# Patient Record
Sex: Male | Born: 1965
Health system: Southern US, Community
[De-identification: ages and names within clinical notes are randomized; demographics above are authoritative.]

## PROBLEM LIST (undated history)

## (undated) DIAGNOSIS — E7800 Pure hypercholesterolemia, unspecified: Secondary | ICD-10-CM

## (undated) HISTORY — PX: ANAL FISTULECTOMY: SHX1139

---

## 1999-12-02 ENCOUNTER — Encounter: Payer: Self-pay | Admitting: Gastroenterology

## 1999-12-02 ENCOUNTER — Encounter: Admission: RE | Admit: 1999-12-02 | Discharge: 1999-12-02 | Payer: Self-pay | Admitting: Gastroenterology

## 1999-12-10 ENCOUNTER — Ambulatory Visit (HOSPITAL_COMMUNITY): Admission: RE | Admit: 1999-12-10 | Discharge: 1999-12-10 | Payer: Self-pay | Admitting: Gastroenterology

## 2013-01-20 ENCOUNTER — Emergency Department (HOSPITAL_COMMUNITY)
Admission: EM | Admit: 2013-01-20 | Discharge: 2013-01-21 | Disposition: A | Discharge status: Home / Self Care | Payer: BC Managed Care – PPO | Attending: Emergency Medicine | Admitting: Emergency Medicine

## 2013-01-20 ENCOUNTER — Encounter (HOSPITAL_COMMUNITY): Payer: Self-pay | Admitting: *Deleted

## 2013-01-20 DIAGNOSIS — E7800 Pure hypercholesterolemia, unspecified: Secondary | ICD-10-CM | POA: Insufficient documentation

## 2013-01-20 DIAGNOSIS — Z8249 Family history of ischemic heart disease and other diseases of the circulatory system: Secondary | ICD-10-CM | POA: Insufficient documentation

## 2013-01-20 DIAGNOSIS — M542 Cervicalgia: Secondary | ICD-10-CM | POA: Insufficient documentation

## 2013-01-20 DIAGNOSIS — R079 Chest pain, unspecified: Secondary | ICD-10-CM

## 2013-01-20 DIAGNOSIS — Z7982 Long term (current) use of aspirin: Secondary | ICD-10-CM | POA: Insufficient documentation

## 2013-01-20 DIAGNOSIS — Z79899 Other long term (current) drug therapy: Secondary | ICD-10-CM | POA: Insufficient documentation

## 2013-01-20 DIAGNOSIS — M79609 Pain in unspecified limb: Secondary | ICD-10-CM | POA: Insufficient documentation

## 2013-01-20 HISTORY — DX: Pure hypercholesterolemia, unspecified: E78.00

## 2013-01-20 LAB — POCT I-STAT TROPONIN I: Troponin i, poc: 0.02 ng/mL (ref 0.00–0.08)

## 2013-01-20 LAB — COMPREHENSIVE METABOLIC PANEL
ALT: 41 U/L (ref 0–53)
AST: 33 U/L (ref 0–37)
Albumin: 4.2 g/dL (ref 3.5–5.2)
Alkaline Phosphatase: 81 U/L (ref 39–117)
BUN: 20 mg/dL (ref 6–23)
CO2: 27 mEq/L (ref 19–32)
Calcium: 9.5 mg/dL (ref 8.4–10.5)
Chloride: 100 mEq/L (ref 96–112)
Creatinine, Ser: 0.95 mg/dL (ref 0.50–1.35)
GFR calc Af Amer: >90 mL/min (ref >90)
GFR calc non Af Amer: >90 mL/min (ref >90)
Glucose, Bld: 105 mg/dL — ABNORMAL HIGH (ref 70–99)
Potassium: 4.3 mEq/L (ref 3.5–5.1)
Sodium: 136 mEq/L (ref 135–145)
Total Bilirubin: 0.9 mg/dL (ref 0.3–1.2)
Total Protein: 7.2 g/dL (ref 6.0–8.3)

## 2013-01-20 LAB — CBC WITH DIFFERENTIAL/PLATELET
Basophils Absolute: 0 10*3/uL (ref 0.0–0.1)
Basophils Relative: 0 % (ref 0–1)
Eosinophils Absolute: 0.3 10*3/uL (ref 0.0–0.7)
Eosinophils Relative: 4 % (ref 0–5)
HCT: 41.7 % (ref 39.0–52.0)
Hemoglobin: 15 g/dL (ref 13.0–17.0)
Lymphocytes Relative: 34 % (ref 12–46)
Lymphs Abs: 3 10*3/uL (ref 0.7–4.0)
MCH: 31.1 pg (ref 26.0–34.0)
MCHC: 36 g/dL (ref 30.0–36.0)
MCV: 86.3 fL (ref 78.0–100.0)
Monocytes Absolute: 1.1 10*3/uL — ABNORMAL HIGH (ref 0.1–1.0)
Monocytes Relative: 12 % (ref 3–12)
Neutro Abs: 4.4 10*3/uL (ref 1.7–7.7)
Neutrophils Relative %: 50 % (ref 43–77)
Platelets: 191 10*3/uL (ref 150–400)
RBC: 4.83 MIL/uL (ref 4.22–5.81)
RDW: 11.8 % (ref 11.5–15.5)
WBC: 8.8 10*3/uL (ref 4.0–10.5)

## 2013-01-20 NOTE — ED Notes (Signed)
Pt states that he has burning CP that starts in his left upper chest. Pt states that it has been going on for 3 days pt states radiates down left arm and into jaw with burning. Pt has hx of high cholesterol and family hx of heart disease.

## 2013-01-21 ENCOUNTER — Emergency Department (HOSPITAL_COMMUNITY): Payer: BC Managed Care – PPO

## 2013-01-21 LAB — POCT I-STAT TROPONIN I: Troponin i, poc: 0.01 ng/mL (ref 0.00–0.08)

## 2013-01-21 MED ORDER — MORPHINE SULFATE 4 MG/ML IJ SOLN
INTRAMUSCULAR | Status: AC
  Filled 2013-01-21: qty 1

## 2013-01-21 MED ORDER — KETOROLAC TROMETHAMINE 60 MG/2ML IM SOLN
60.0000 mg | Freq: Once | INTRAMUSCULAR | Status: AC
  Administered 2013-01-21: 60 mg via INTRAMUSCULAR
  Filled 2013-01-21: qty 2

## 2013-01-21 MED ORDER — NITROGLYCERIN 0.4 MG SL SUBL
0.4000 mg | SUBLINGUAL_TABLET | SUBLINGUAL | Status: DC | PRN
  Administered 2013-01-21 (×2): 0.4 mg via SUBLINGUAL
  Filled 2013-01-21: qty 25

## 2013-01-21 MED ORDER — MORPHINE SULFATE 4 MG/ML IJ SOLN: 4.0000 mg | Freq: Once | INTRAMUSCULAR | Status: DC

## 2013-01-21 NOTE — ED Provider Notes (Signed)
CSN: [id number]     Arrival date & time 01/20/13  2201 History   First MD Initiated Contact with Patient 01/20/13 2331     No chief complaint on file.  (Consider location/radiation/quality/duration/timing/severity/associated sxs/prior Treatment) Patient is a 47 y.o. male presenting with chest pain. The history is provided by the patient. No language interpreter was used.  Chest Pain Pain location:  L chest Pain quality: burning   Pain radiates to:  Neck and L arm Pain radiates to the back: no   Pain severity:  Mild Onset quality:  Unable to specify Duration:  3 days Timing:  Constant Progression:  Waxing and waning Chronicity:  New Context: at rest   Relieved by:  Nothing Worsened by:  Nothing tried Ineffective treatments:  None tried Associated symptoms: no abdominal pain, no back pain, no cough, no diaphoresis, no dizziness, no dysphagia, no fatigue, no fever, no headache, no lower extremity edema, no nausea, no numbness, no shortness of breath, not vomiting and no weakness   Risk factors: high cholesterol and male sex   Risk factors: no birth control, no coronary artery disease, no diabetes mellitus, no hypertension, not obese, no prior DVT/PE and no smoking     Past Medical History  Diagnosis Date  . High cholesterol    Past Surgical History  Procedure Laterality Date  . Anal fistulectomy     History reviewed. No pertinent family history. History  Substance Use Topics  . Smoking status: Never Smoker   . Smokeless tobacco: Not on file  . Alcohol Use: Yes    Review of Systems  Constitutional: Negative for fever, diaphoresis, activity change, appetite change and fatigue.  HENT: Negative for congestion, facial swelling, rhinorrhea and trouble swallowing.   Eyes: Negative for photophobia and pain.  Respiratory: Negative for cough, chest tightness and shortness of breath.   Cardiovascular: Positive for chest pain. Negative for leg swelling.  Gastrointestinal: Negative  for nausea, vomiting, abdominal pain, diarrhea and constipation.  Endocrine: Negative for polydipsia and polyuria.  Genitourinary: Negative for dysuria, urgency, decreased urine volume and difficulty urinating.  Musculoskeletal: Negative for back pain and gait problem.  Skin: Negative for color change, rash and wound.  Allergic/Immunologic: Negative for immunocompromised state.  Neurological: Negative for dizziness, facial asymmetry, speech difficulty, weakness, numbness and headaches.  Psychiatric/Behavioral: Negative for confusion, decreased concentration and agitation.    Allergies  Review of patient's allergies indicates no known allergies.  Home Medications   Current Outpatient Rx  Name  Route  Sig  Dispense  Refill  . Ascorbic Acid (VITAMIN C) 100 MG tablet   Oral   Take 100 mg by mouth daily.         Marland Kitchen aspirin 325 MG EC tablet   Oral   Take 325 mg by mouth daily.         Marland Kitchen co-enzyme Q-10 30 MG capsule   Oral   Take 30 mg by mouth daily.         . colesevelam (WELCHOL) 625 MG tablet   Oral   Take 1,875 mg by mouth daily.         Marland Kitchen FIBER FORMULA PO   Oral   Take 1 tablet by mouth daily.         . Multiple Vitamin (MULTI-VITAMIN DAILY PO)   Oral   Take 1 tablet by mouth daily.          BP 136/91  Pulse 67  Temp(Src) 98.2 F (36.8 C) (Oral)  Resp  10  SpO2 98% Physical Exam  Constitutional: He is oriented to person, place, and time. He appears well-developed and well-nourished. No distress.  HENT:  Head: Normocephalic and atraumatic.  Mouth/Throat: No oropharyngeal exudate.  Eyes: Pupils are equal, round, and reactive to light.  Neck: Normal range of motion. Neck supple.  Cardiovascular: Normal rate, regular rhythm and normal heart sounds.  Exam reveals no gallop and no friction rub.   No murmur heard. Pulmonary/Chest: Effort normal and breath sounds normal. No respiratory distress. He has no wheezes. He has no rales.  Abdominal: Soft. Bowel  sounds are normal. He exhibits no distension and no mass. There is no tenderness. There is no rebound and no guarding.  Musculoskeletal: Normal range of motion. He exhibits no edema and no tenderness.  Neurological: He is alert and oriented to person, place, and time.  Skin: Skin is warm and dry.  Psychiatric: He has a normal mood and affect.    ED Course  Procedures (including critical care time) Labs Review Labs Reviewed  CBC WITH DIFFERENTIAL - Abnormal; Notable for the following:    Monocytes Absolute 1.1 (*)    All other components within normal limits  COMPREHENSIVE METABOLIC PANEL - Abnormal; Notable for the following:    Glucose, Bld 105 (*)    All other components within normal limits  POCT I-STAT TROPONIN I  POCT I-STAT TROPONIN I   Imaging Review Dg Chest 2 View  01/21/2013   *RADIOLOGY REPORT*  Clinical Data: Left-sided chest pain and burning sensation.  CHEST - 2 VIEW  Comparison: None.  Findings: Shallow inspiration. The heart size and pulmonary vascularity are normal. The lungs appear clear and expanded without focal air space disease or consolidation. No blunting of the costophrenic angles.  No pneumothorax.  Mediastinal contours appear intact.  Degenerative changes in the spine.  IMPRESSION: No evidence of active pulmonary disease.   Original Report Authenticated By: Burman Nieves, M.D.    Date: 01/21/2013  Rate: 79  Rhythm: normal sinus rhythm  QRS Axis: normal  Intervals: normal  ST/T Wave abnormalities: normal  Conduction Disutrbances:none  Narrative Interpretation:   Old EKG Reviewed: none available    MDM   1. Chest pain at rest    Pt is a 47 y.o. male with Pmhx as above who presents with 3 days oc constant, L sided chest burning, with radiation to neck & L arm.  No other assoc symptoms, EKG unremarkable. Has FH of CAD, but not premature CAD.  No other risk factors.  Does admit to heavier than normal drinking at the beach this weekend with friends. First  trop 0.02, 4 hrs delta trop 0.01.  Pain not changed by NTG or morphine.  Pt low risk for MACE by HEART score and I doubt ACS.  However, i have asked he call Dr. Waynard Edwards today for f/u appt and to discuss tress testing. Return precautions given for new or worsening symptoms   1. Chest pain at rest         Shanna Cisco, MD 01/21/13 2037

## 2013-01-21 NOTE — ED Notes (Signed)
Patient returned from X-ray 

## 2013-01-21 NOTE — ED Notes (Signed)
Patient transported to X-ray 

## 2014-08-29 ENCOUNTER — Encounter (HOSPITAL_COMMUNITY): Payer: Self-pay

## 2014-08-29 ENCOUNTER — Emergency Department (HOSPITAL_COMMUNITY): Payer: BLUE CROSS/BLUE SHIELD

## 2014-08-29 ENCOUNTER — Emergency Department (HOSPITAL_COMMUNITY)
Admission: EM | Admit: 2014-08-29 | Discharge: 2014-08-29 | Disposition: A | Discharge status: Home / Self Care | Payer: BLUE CROSS/BLUE SHIELD | Attending: Emergency Medicine | Admitting: Emergency Medicine

## 2014-08-29 DIAGNOSIS — N200 Calculus of kidney: Secondary | ICD-10-CM

## 2014-08-29 DIAGNOSIS — Z9889 Other specified postprocedural states: Secondary | ICD-10-CM | POA: Insufficient documentation

## 2014-08-29 DIAGNOSIS — Z79899 Other long term (current) drug therapy: Secondary | ICD-10-CM | POA: Insufficient documentation

## 2014-08-29 DIAGNOSIS — Z791 Long term (current) use of non-steroidal anti-inflammatories (NSAID): Secondary | ICD-10-CM | POA: Diagnosis not present

## 2014-08-29 DIAGNOSIS — R109 Unspecified abdominal pain: Secondary | ICD-10-CM

## 2014-08-29 DIAGNOSIS — R1031 Right lower quadrant pain: Secondary | ICD-10-CM | POA: Diagnosis present

## 2014-08-29 DIAGNOSIS — Z8639 Personal history of other endocrine, nutritional and metabolic disease: Secondary | ICD-10-CM | POA: Diagnosis not present

## 2014-08-29 LAB — URINALYSIS, ROUTINE W REFLEX MICROSCOPIC
BILIRUBIN URINE: NEGATIVE
GLUCOSE, UA: NEGATIVE mg/dL
Ketones, ur: NEGATIVE mg/dL
Leukocytes, UA: NEGATIVE
Nitrite: NEGATIVE
Protein, ur: NEGATIVE mg/dL
Specific Gravity, Urine: 1.012 (ref 1.005–1.030)
Urobilinogen, UA: 0.2 mg/dL (ref 0.0–1.0)
pH: 6 (ref 5.0–8.0)

## 2014-08-29 LAB — I-STAT CHEM 8, ED
BUN: 24 mg/dL — ABNORMAL HIGH (ref 6–23)
CHLORIDE: 100 mmol/L (ref 96–112)
Calcium, Ion: 1.24 mmol/L — ABNORMAL HIGH (ref 1.12–1.23)
Creatinine, Ser: 1.2 mg/dL (ref 0.50–1.35)
Glucose, Bld: 115 mg/dL — ABNORMAL HIGH (ref 70–99)
HCT: 48 % (ref 39.0–52.0)
HEMOGLOBIN: 16.3 g/dL (ref 13.0–17.0)
POTASSIUM: 3.5 mmol/L (ref 3.5–5.1)
SODIUM: 141 mmol/L (ref 135–145)
TCO2: 26 mmol/L (ref 0–100)

## 2014-08-29 LAB — URINE MICROSCOPIC-ADD ON

## 2014-08-29 MED ORDER — ONDANSETRON 8 MG PO TBDP: 8.0000 mg | ORAL_TABLET | Freq: Three times a day (TID) | ORAL | Status: AC | PRN

## 2014-08-29 MED ORDER — HYDROCODONE-ACETAMINOPHEN 5-325 MG PO TABS: 2.0000 | ORAL_TABLET | ORAL | Status: AC | PRN

## 2014-08-29 MED ORDER — IBUPROFEN 800 MG PO TABS
800.0000 mg | ORAL_TABLET | Freq: Three times a day (TID) | ORAL | Status: AC
Start: 2014-08-29 — End: ?

## 2014-08-29 MED ORDER — SODIUM CHLORIDE 0.9 % IV SOLN
Freq: Once | INTRAVENOUS | Status: AC
  Administered 2014-08-29: 06:00:00 via INTRAVENOUS

## 2014-08-29 MED ORDER — MORPHINE SULFATE 4 MG/ML IJ SOLN
4.0000 mg | Freq: Once | INTRAMUSCULAR | Status: DC
  Filled 2014-08-29: qty 1

## 2014-08-29 MED ORDER — KETOROLAC TROMETHAMINE 30 MG/ML IJ SOLN
INTRAMUSCULAR | Status: AC
  Administered 2014-08-29: 30 mg via INTRAVENOUS
  Filled 2014-08-29: qty 1

## 2014-08-29 MED ORDER — TAMSULOSIN HCL 0.4 MG PO CAPS: 0.4000 mg | ORAL_CAPSULE | Freq: Every day | ORAL | Status: AC

## 2014-08-29 MED ORDER — ONDANSETRON HCL 4 MG/2ML IJ SOLN
4.0000 mg | Freq: Once | INTRAMUSCULAR | Status: AC
  Administered 2014-08-29: 4 mg via INTRAVENOUS
  Filled 2014-08-29: qty 2

## 2014-08-29 MED ORDER — KETOROLAC TROMETHAMINE 30 MG/ML IJ SOLN
30.0000 mg | Freq: Once | INTRAMUSCULAR | Status: AC
  Administered 2014-08-29: 30 mg via INTRAVENOUS

## 2014-08-29 NOTE — ED Notes (Signed)
Pt complains of right sided flank pain that is radiating to his groin, hx of kidney stones, pain feels the same

## 2014-08-29 NOTE — Discharge Instructions (Signed)
You have a 2 mm stone on the right about to pass into your bladder.  This should pass on its own.  Drink plenty of fluids.  Take pain and nausea medications as prescribed.  Return to the ER for worsening pain, or nausea despite medications, fever, or other new concerning symptoms.  Follow up with urology as listed above.  Your CT scan  also shows one other very small kidney stone in your right kidney that may cause problems in the future.  If you have problems with kidney stones either with your current stone or with future stones, the urology group prefers that you be seen at the [facility] versus [facility].  They are better equipped to handle complications at the [facility].      Kidney Stones Kidney stones (urolithiasis) are deposits that form inside your kidneys. The intense pain is caused by the stone moving through the urinary tract. When the stone moves, the ureter goes into spasm around the stone. The stone is usually passed in the urine.  CAUSES   A disorder that makes certain neck glands produce too much parathyroid hormone (primary hyperparathyroidism).  A buildup of uric acid crystals, similar to gout in your joints.  Narrowing (stricture) of the ureter.  A kidney obstruction present at birth (congenital obstruction).  Previous surgery on the kidney or ureters.  Numerous kidney infections. SYMPTOMS   Feeling sick to your stomach (nauseous).  Throwing up (vomiting).  Blood in the urine (hematuria).  Pain that usually spreads (radiates) to the groin.  Frequency or urgency of urination. DIAGNOSIS   Taking a history and physical exam.  Blood or urine tests.  CT scan.  Occasionally, an examination of the inside of the urinary bladder (cystoscopy) is performed. TREATMENT   Observation.  Increasing your fluid intake.  Extracorporeal shock wave lithotripsy--This is a noninvasive procedure that uses shock waves to break up kidney stones.  Surgery may  be needed if you have severe pain or persistent obstruction. There are various surgical procedures. Most of the procedures are performed with the use of small instruments. Only small incisions are needed to accommodate these instruments, so recovery time is minimized. The size, location, and chemical composition are all important variables that will determine the proper choice of action for you. Talk to your health care provider to better understand your situation so that you will minimize the risk of injury to yourself and your kidney.  HOME CARE INSTRUCTIONS   Drink enough water and fluids to keep your urine clear or pale yellow. This will help you to pass the stone or stone fragments.  Strain all urine through the provided strainer. Keep all particulate matter and stones for your health care provider to see. The stone causing the pain may be as small as a grain of salt. It is very important to use the strainer each and every time you pass your urine. The collection of your stone will allow your health care provider to analyze it and verify that a stone has actually passed. The stone analysis will often identify what you can do to reduce the incidence of recurrences.  Only take over-the-counter or prescription medicines for pain, discomfort, or fever as directed by your health care provider.  Make a follow-up appointment with your health care provider as directed.  Get follow-up X-rays if required. The absence of pain does not always mean that the stone has passed. It may have only stopped moving. If the urine remains  completely obstructed, it can cause loss of kidney function or even complete destruction of the kidney. It is your responsibility to make sure X-rays and follow-ups are completed. Ultrasounds of the kidney can show blockages and the status of the kidney. Ultrasounds are not associated with any radiation and can be performed easily in a matter of minutes. SEEK MEDICAL CARE IF:  You  experience pain that is progressive and unresponsive to any pain medicine you have been prescribed. SEEK IMMEDIATE MEDICAL CARE IF:   Pain cannot be controlled with the prescribed medicine.  You have a fever or shaking chills.  The severity or intensity of pain increases over 18 hours and is not relieved by pain medicine.  You develop a new onset of abdominal pain.  You feel faint or pass out.  You are unable to urinate. MAKE SURE YOU:   Understand these instructions.  Will watch your condition.  Will get help right away if you are not doing well or get worse. Document Released: 05/02/2005 Document Revised: 01/02/2013 Document Reviewed: 10/03/2012 [facility] Patient Information 2015 [place], [place]. This information is not intended to replace advice given to you by your health care provider. Make sure you discuss any questions you have with your health care provider.

## 2014-08-29 NOTE — ED Provider Notes (Signed)
CSN: [id number]     Arrival date & time 08/29/14  0426 History   First MD Initiated Contact with Patient 08/29/14 0444     Chief Complaint  Patient presents with  . Flank Pain     (Consider location/radiation/quality/duration/timing/severity/associated sxs/prior Treatment) HPI 49 year old male presents to the [facility] heart with complaint of right flank pain.  Patient reports prior history of kidney stone many years ago and feels that the pain today is similar.  Pain started yesterday evening, worsened tonight waking from sleep.  No nausea or vomiting.  No fevers.  Prior stones have passed on their own. Past Medical History  Diagnosis Date  . High cholesterol    Past Surgical History  Procedure Laterality Date  . Anal fistulectomy     History reviewed. No pertinent family history. History  Substance Use Topics  . Smoking status: Never Smoker   . Smokeless tobacco: Not on file  . Alcohol Use: Yes    Review of Systems   See History of Present Illness; otherwise all other systems are reviewed and negative  Allergies  Review of patient's allergies indicates no known allergies.  Home Medications   Prior to Admission medications   Medication Sig Start Date End Date Taking? Authorizing Provider  Ascorbic Acid (VITAMIN C) 100 MG tablet Take 100 mg by mouth daily.   Yes Historical Provider, MD  co-enzyme Q-10 30 MG capsule Take 30 mg by mouth daily.   Yes Historical Provider, MD  colesevelam (WELCHOL) 625 MG tablet Take 1,875 mg by mouth daily.   Yes Historical Provider, MD  FIBER FORMULA PO Take 1 tablet by mouth daily.   Yes Historical Provider, MD  Multiple Vitamin (MULTI-VITAMIN DAILY PO) Take 1 tablet by mouth daily.   Yes Historical Provider, MD  naproxen sodium (ANAPROX) 220 MG tablet Take 440 mg by mouth 2 (two) times daily as needed (pain).   Yes Historical Provider, MD  ZETIA 10 MG tablet Take 10 mg by mouth daily. 08/01/14  Yes Historical Provider, MD   HYDROcodone-acetaminophen (NORCO/VICODIN) 5-325 MG per tablet Take 2 tablets by mouth every 4 (four) hours as needed. 08/29/14   Marisa Severin, MD  ibuprofen (ADVIL,MOTRIN) 800 MG tablet Take 1 tablet (800 mg total) by mouth 3 (three) times daily. 08/29/14   Marisa Severin, MD  ondansetron (ZOFRAN ODT) 8 MG disintegrating tablet Take 1 tablet (8 mg total) by mouth every 8 (eight) hours as needed for nausea or vomiting. 08/29/14   Marisa Severin, MD  tamsulosin (FLOMAX) 0.4 MG CAPS capsule Take 1 capsule (0.4 mg total) by mouth daily. 08/29/14   Marisa Severin, MD   BP 159/91 mmHg  Pulse 66  Temp(Src) 98.5 F (36.9 C) (Oral)  Resp 20  Ht  (1.803 m)  Wt 206 lb (93.441 kg)  BMI 28.74 kg/m2  SpO2 100% Physical Exam  Constitutional: He is oriented to person, place, and time. He appears well-developed and well-nourished.  HENT:  Head: Normocephalic and atraumatic.  Nose: Nose normal.  Mouth/Throat: Oropharynx is clear and moist.  Eyes: Conjunctivae and EOM are normal. Pupils are equal, round, and reactive to light.  Neck: Normal range of motion. Neck supple. No JVD present. No tracheal deviation present. No thyromegaly present.  Cardiovascular: Normal rate, regular rhythm, normal heart sounds and intact distal pulses.  Exam reveals no gallop and no friction rub.   No murmur heard. Pulmonary/Chest: Effort normal and breath sounds normal. No stridor. No respiratory distress. He has no wheezes. He has  no rales. He exhibits no tenderness.  Abdominal: Soft. Bowel sounds are normal. He exhibits no distension and no mass. There is no tenderness. There is no rebound and no guarding.  Musculoskeletal: Normal range of motion. He exhibits no edema or tenderness.  Lymphadenopathy:    He has no cervical adenopathy.  Neurological: He is alert and oriented to person, place, and time. He displays normal reflexes. He exhibits normal muscle tone. Coordination normal.  Skin: Skin is warm and dry. No rash noted. No  erythema. No pallor.  Psychiatric: He has a normal mood and affect. His behavior is normal. Judgment and thought content normal.  Nursing note and vitals reviewed.   ED Course  Procedures (including critical care time) Labs Review Labs Reviewed  URINALYSIS, ROUTINE W REFLEX MICROSCOPIC - Abnormal; Notable for the following:    APPearance CLOUDY (*)    Hgb urine dipstick LARGE (*)    All other components within normal limits  I-STAT CHEM 8, ED - Abnormal; Notable for the following:    BUN 24 (*)    Glucose, Bld 115 (*)    Calcium, Ion 1.24 (*)    All other components within normal limits  URINE MICROSCOPIC-ADD ON    Imaging Review Ct Renal Stone Study  08/29/2014   CLINICAL DATA:  Right flank pain  EXAM: CT ABDOMEN AND PELVIS WITHOUT CONTRAST  TECHNIQUE: Multidetector CT imaging of the abdomen and pelvis was performed following the standard protocol without IV contrast.  COMPARISON:  None.  FINDINGS: BODY WALL: No contributory findings.  LOWER CHEST: No contributory findings.  ABDOMEN/PELVIS:  Liver: Scattered low-density liver lesions have an appearance most consistent with incidental cysts. The largest is in the posterior right liver at 42 mm  Biliary: No evidence of biliary obstruction or stone.  Pancreas: Unremarkable.  Spleen: Unremarkable.  Adrenals: Unremarkable.  Kidneys and ureters: 2 mm stone at the right ureteral vesicular junction with mild hydroureteronephrosis. There is a punctate stone in the interpolar right kidney. No left-sided hydronephrosis or urolithiasis.  Bladder: Unremarkable.  Reproductive: No pathologic findings.  Bowel: No obstruction. Negative appendix.  Retroperitoneum: No mass or adenopathy.  Peritoneum: No ascites or pneumoperitoneum.  Vascular: No acute abnormality.  OSSEOUS: Transitional lumbosacral anatomy with rudimentary disc space at S1-S2. There are bilateral pars defects at L5 with borderline grade 2 anterolisthesis. Disc uncovering and slip causes  bilateral foraminal stenosis.  IMPRESSION: 1. 2 mm stone at the right UVJ with mild urinary obstruction. 2. Punctate right nephrolithiasis. 3. L5 pars defects with anterolisthesis and foraminal stenosis.   Electronically Signed   By: Marnee SpringJonathon  Watts M.D.   On: 08/29/2014 06:52     EKG Interpretation None      MDM   Final diagnoses:  Right flank pain  Kidney stone on right side    49 year old male with right flank pain.  Pain does appear colicky in nature.  Has history of same.  Unable to urinate.  CT scan shows 2 mm stone at right UVJ.  Patient has been able to urinate.  He is comfortable after Toradol.  Stable for discharge home.    Marisa Severinlga Alecsander Hattabaugh, MD 08/29/14 [phone]

## 2015-02-18 ENCOUNTER — Ambulatory Visit (INDEPENDENT_AMBULATORY_CARE_PROVIDER_SITE_OTHER): Payer: BLUE CROSS/BLUE SHIELD | Admitting: Sports Medicine

## 2015-02-18 ENCOUNTER — Encounter: Payer: Self-pay | Admitting: Sports Medicine

## 2015-02-18 VITALS — BP 148/96 | HR 76 | Ht 71.0 in | Wt 220.0 lb

## 2015-02-18 DIAGNOSIS — M2142 Flat foot [pes planus] (acquired), left foot: Secondary | ICD-10-CM

## 2015-02-18 DIAGNOSIS — M79671 Pain in right foot: Secondary | ICD-10-CM

## 2015-02-18 DIAGNOSIS — M2141 Flat foot [pes planus] (acquired), right foot: Secondary | ICD-10-CM

## 2015-02-18 DIAGNOSIS — M76821 Posterior tibial tendinitis, right leg: Secondary | ICD-10-CM

## 2015-02-18 NOTE — Progress Notes (Signed)
Andre Rogers - 49 y.o. male MRN [id number]  Date of birth: April 04, 1966  CC: Right medial foot pain. Also has left ankle pain at times.   SUBJECTIVE:   HPI  B/l foot pain: Medial midfoot.  - > 1-2 years - Less frequent previously.  - Walking bothers him the most.  Lingers for for several hours afterwards.  - Also bothers him in the most morning.    - Tore left hamstring '11. Compartment syndrome in the Lower leg '01. No other injuries to LEs - NO acute injuries - NO medications - 8/10 pain at the worst.  -  Has not tried any measures to improve pain.   - Wears crocs at work, but really doesn't walk much there.  - Bought arch supports, but really didn't wear them.   - No night time pain.   ROS:     none  HISTORY: Past Medical, Surgical, Social, and Family History Reviewed & Updated per EMR.    OBJECTIVE: BP 148/96 mmHg  Pulse 76  Ht  (1.803 m)  Wt 220 lb (99.791 kg)  BMI 30.70 kg/m2  Physical Exam  Clam, no distress Non-labored breathing.  Ankle: b/l No visible erythema or swelling. Prominent Navicular, R>L Range of motion is full in all directions. Strength is 5/5 in all directions. Stable lateral and medial ligaments; squeeze test and kleiger test unremarkable; Talar dome nontender; No pain at base of 5th MT; No tenderness over cuboid; Tenderness over navicular prominence. + Navicular drop.  No tenderness on posterior aspects of lateral and medial malleolus No sign of peroneal tendon subluxations; Negative tarsal tunnel tinel's Able to walk 4 steps.   Pes planus b/l, R>L. Also with shortened 1st MT.               Overpronation  Imaging: [place] image of the right medial ankle and foot. in both long and short axis obtained. The right  Posterior tibialis tendonappears as the expected fibrillar pattern as it courses behind the medial malleolus.  As it attaches to the navicular there if fiber disruption with hypoechoic changes.  These findings suggest Post Tib.  insertional tendinopathy.     MEDICATIONS, LABS & OTHER ORDERS: Previous Medications   ASCORBIC ACID (VITAMIN C) 100 MG TABLET    Take 100 mg by mouth daily.   CO-ENZYME Q-10 30 MG CAPSULE    Take 30 mg by mouth daily.   COLESEVELAM (WELCHOL) 625 MG TABLET    Take 1,875 mg by mouth daily.   FIBER FORMULA PO    Take 1 tablet by mouth daily.   GLUCOSAMINE-CHONDROITIN 500-400 MG TABLET    Take 1 tablet by mouth 3 (three) times daily.   HYDROCODONE-ACETAMINOPHEN (NORCO/VICODIN) 5-325 MG PER TABLET    Take 2 tablets by mouth every 4 (four) hours as needed.   IBUPROFEN (ADVIL,MOTRIN) 800 MG TABLET    Take 1 tablet (800 mg total) by mouth 3 (three) times daily.   MULTIPLE VITAMIN (MULTI-VITAMIN DAILY PO)    Take 1 tablet by mouth daily.   NAPROXEN SODIUM (ANAPROX) 220 MG TABLET    Take 440 mg by mouth 2 (two) times daily as needed (pain).   ONDANSETRON (ZOFRAN ODT) 8 MG DISINTEGRATING TABLET    Take 1 tablet (8 mg total) by mouth every 8 (eight) hours as needed for nausea or vomiting.   TAMSULOSIN (FLOMAX) 0.4 MG CAPS CAPSULE    Take 1 capsule (0.4 mg total) by mouth daily.   ZETIA 10  MG TABLET    Take 10 mg by mouth daily.   Modified Medications   No medications on file   New Prescriptions   No medications on file   Discontinued Medications   No medications on file  No orders of the defined types were placed in this encounter.   ASSESSMENT & PLAN: See problem based charting & AVS for pt instructions.

## 2015-02-18 NOTE — Patient Instructions (Addendum)
You have posterior tibilias tendinopathy, likely secondary to compensation for flat feet:  - We have given you insoles to help support your foot.  - Please try to do the heel drop exercises with your heel turned outward.  - Use the ankle compression sleeve while at work.  - We will see you back in 4 weeks and consider orthotics at that time.   Exercises are [place] toe walk x room 10 times Step ups 3 x 15 with toe turned in Heel raises on step with toe turned inward  Schedule to dr Mayford Knife in 4 weeks for orthotics

## 2015-02-19 DIAGNOSIS — M2141 Flat foot [pes planus] (acquired), right foot: Secondary | ICD-10-CM | POA: Insufficient documentation

## 2015-02-19 DIAGNOSIS — M2142 Flat foot [pes planus] (acquired), left foot: Secondary | ICD-10-CM

## 2015-02-19 DIAGNOSIS — M76829 Posterior tibial tendinitis, unspecified leg: Secondary | ICD-10-CM | POA: Insufficient documentation

## 2015-02-19 NOTE — Assessment & Plan Note (Signed)
Posterior Tibialis tendinopathy 2/2 foot structure.  Identrified on u/s.  -  Will help his support his arch with insoles with scaphoid pads. Also provided with heel wedge to help correct overpronation.  - Instructed on  heel drop exercises with your heel turned outward.  - Snkle compression sleeve while at work.  - F/u in 3-4 weeks for orthotics.

## 2015-04-02 ENCOUNTER — Ambulatory Visit (INDEPENDENT_AMBULATORY_CARE_PROVIDER_SITE_OTHER): Payer: BLUE CROSS/BLUE SHIELD | Admitting: Sports Medicine

## 2015-04-02 ENCOUNTER — Encounter: Payer: Self-pay | Admitting: Sports Medicine

## 2015-04-02 VITALS — BP 156/112 | HR 68 | Ht 71.0 in | Wt 220.0 lb

## 2015-04-02 DIAGNOSIS — R269 Unspecified abnormalities of gait and mobility: Secondary | ICD-10-CM | POA: Diagnosis not present

## 2015-04-02 NOTE — Assessment & Plan Note (Signed)
Patient was fitted for a standard, cushioned, semi-rigid orthotic. The orthotic was heated and afterward the patient stood on the orthotic blank positioned on the orthotic stand. The patient was positioned in subtalar neutral position and 10 degrees of ankle dorsiflexion in a weight bearing stance. After completion of molding, a stable base was applied to the orthotic blank. The blank was ground to a stable position for weight bearing. Size: 12 Base: [facility] Additional Posting and Padding: 1st ray post  The patient ambulated these, and they were very comfortable.  I spent 40 minutes with this patient, greater than 50% was face-to-face time counseling regarding the below diagnosis.  Pt pes planus and 1st ray pronation much improved after watching ambulation.

## 2015-04-02 NOTE — Progress Notes (Signed)
  Andre AngelSteven S Rogers - 49 y.o. male MRN [id number]  Date of birth: Sep 24, 1965  CC: Right medial foot pain. Also has left ankle pain at times.   SUBJECTIVE:   HPI  B/l foot pain: Medial midfoot.  - > 1-2 years - Walking bothers him the most.  Lingers for for several hours afterwards.  - Also bothers him in the most morning.    - NO acute injuries - NO medications - 8/10 pain at the worst.  -  Has not tried any measures to improve pain.   - Wears crocs at work, but really doesn't walk much there.  - Bought arch supports, but really didn't wear them.   - No night time pain.   ROS:     none  HISTORY: Past Medical, Surgical, Social, and Family History Reviewed & Updated per EMR.    OBJECTIVE: Pulse 68  Ht 5\' 11"  (1.803 m)  Wt 220 lb (99.791 kg)  BMI 30.70 kg/m2  Physical Exam  Clam, no distress Non-labored breathing.  Ankle: b/l No visible erythema or swelling. Prominent Navicular, R>L Range of motion is full in all directions. Strength is 5/5 in all directions. Stable lateral and medial ligaments; squeeze test and kleiger test unremarkable; Talar dome nontender; No pain at base of 5th MT; No tenderness over cuboid; Tenderness over navicular prominence. + Navicular drop.  No tenderness on posterior aspects of lateral and medial malleolus No sign of peroneal tendon subluxations; Negative tarsal tunnel tinel's Able to walk 4 steps.   Pes planus b/l, R>L. Also with shortened 1st MT.               Overpronation

## 2015-06-25 ENCOUNTER — Ambulatory Visit (INDEPENDENT_AMBULATORY_CARE_PROVIDER_SITE_OTHER): Payer: BLUE CROSS/BLUE SHIELD | Admitting: Sports Medicine

## 2015-06-25 ENCOUNTER — Ambulatory Visit
Admission: RE | Admit: 2015-06-25 | Discharge: 2015-06-25 | Disposition: A | Discharge status: Home / Self Care | Payer: BLUE CROSS/BLUE SHIELD | Source: Ambulatory Visit | Attending: Sports Medicine | Admitting: Sports Medicine

## 2015-06-25 ENCOUNTER — Encounter: Payer: Self-pay | Admitting: Sports Medicine

## 2015-06-25 VITALS — BP 147/92 | HR 65 | Ht 71.0 in | Wt 208.0 lb

## 2015-06-25 DIAGNOSIS — M2142 Flat foot [pes planus] (acquired), left foot: Secondary | ICD-10-CM | POA: Diagnosis not present

## 2015-06-25 DIAGNOSIS — M2141 Flat foot [pes planus] (acquired), right foot: Secondary | ICD-10-CM | POA: Diagnosis not present

## 2015-06-25 DIAGNOSIS — M76821 Posterior tibial tendinitis, right leg: Secondary | ICD-10-CM | POA: Diagnosis not present

## 2015-06-25 NOTE — Assessment & Plan Note (Signed)
This has pain at this point is located at the navicular right greater than left. Orthotics did not help and he has worn down the medial aspect/olongitudinal arch of his right foot. Scaphoid pad in the medial heel wedge for added today. -Concern for underlying positive navicular Harris which underlying exercises and orthotics could be exacerbating. He is also getting a degree of what appears to be sinus tarsi syndrome secondary to excessive hindfoot valgus. -If truly does have an os navicularis, which we will obtain 3 view right foot x-rays today would consider continuing with compression as well as possible injection to the area to calm down inflammation. Could also consider 3-4 week period of complete rest in [employer].

## 2015-06-25 NOTE — Progress Notes (Signed)
  Andre Rogers - 50 y.o. male MRN [id number]  Date of birth: 1965/12/11  CC: Right medial foot pain. Also has left ankle pain at times.   SUBJECTIVE:   HPI  Bilateral midfoot pain, follow-up-patient presents today for ongoing midfoot dorsal/plantar pain medial aspect. This been ongoing now for several months and was originally seen in October 2016. At that time there was concern for posterior tibialis tendinitis right greater than left secondary to medial longitudinal arch collapse. He was fitted for orthotics in November 2016 and was doing heel raises along with a compression sleeve for posterior tibialis tendinitis. He has not seen much improvement since then and actually states that it has gotten worse. Denies any change in activity or new injury at this time. Denies any paresthesias going distally.  ROS:     Per history of present illness  HISTORY: Past Medical, Surgical, Social, and Family History Reviewed & Updated per EMR.    OBJECTIVE: BP 147/92 mmHg  Pulse 65  Ht  (1.803 m)  Wt 208 lb (94.348 kg)  BMI 29.02 kg/m2  Physical Exam  Clam, no distress Non-labored breathing.  Ankle: b/l No visible erythema or swelling. Prominent Navicular, R>L. Slight tenderness palpation on the right Range of motion is full in all directions. Strength is 5/5 in all directions. No pain at base of 5th MT; No tenderness over cuboid; Tenderness over navicular prominence. + Navicular drop.  Negative tarsal tunnel tinel's Able to walk 4 steps.   Pes planus b/l, R>L. Also with shortened 1st MT.               Overpronation  Imaging: Ultrasound shows animal anechoic fluid surrounding the posterior tibialis tendon at the medial malleolus. This was followed distally to the insertion on the navicular tubercle. Minimal fluid surrounding the tendon but there is deep anechoic region at the navicular. There also is a hyperechoic area around the tarsal tunnel with shadowing in this region.

## 2015-09-04 DIAGNOSIS — Z0000 Encounter for general adult medical examination without abnormal findings: Secondary | ICD-10-CM | POA: Diagnosis not present

## 2015-09-04 DIAGNOSIS — Z125 Encounter for screening for malignant neoplasm of prostate: Secondary | ICD-10-CM | POA: Diagnosis not present

## 2015-09-04 DIAGNOSIS — E784 Other hyperlipidemia: Secondary | ICD-10-CM | POA: Diagnosis not present

## 2015-09-11 DIAGNOSIS — N200 Calculus of kidney: Secondary | ICD-10-CM | POA: Diagnosis not present

## 2015-09-11 DIAGNOSIS — M79671 Pain in right foot: Secondary | ICD-10-CM | POA: Diagnosis not present

## 2015-09-11 DIAGNOSIS — M79672 Pain in left foot: Secondary | ICD-10-CM | POA: Diagnosis not present

## 2015-09-11 DIAGNOSIS — E784 Other hyperlipidemia: Secondary | ICD-10-CM | POA: Diagnosis not present

## 2015-09-11 DIAGNOSIS — Z1389 Encounter for screening for other disorder: Secondary | ICD-10-CM | POA: Diagnosis not present

## 2015-09-11 DIAGNOSIS — Z0000 Encounter for general adult medical examination without abnormal findings: Secondary | ICD-10-CM | POA: Diagnosis not present

## 2015-09-11 DIAGNOSIS — Z23 Encounter for immunization: Secondary | ICD-10-CM | POA: Diagnosis not present

## 2016-02-19 DIAGNOSIS — Z23 Encounter for immunization: Secondary | ICD-10-CM | POA: Diagnosis not present

## 2016-05-14 IMAGING — CT CT RENAL STONE PROTOCOL
1 series · 15 of 24 positions shown, 19 images · non-contrast
Comparison: None.

CLINICAL DATA: Right flank pain

EXAM:
CT ABDOMEN AND PELVIS WITHOUT CONTRAST
TECHNIQUE: Multidetector CT imaging of the abdomen and pelvis was performed
following the standard protocol without IV contrast.

[Series 3: lung · axial · 0.74mm/px · z∈[+28,+134]mm · 15 of 24 slices shown, 19 images]
[im 2/24  soft-tissue]
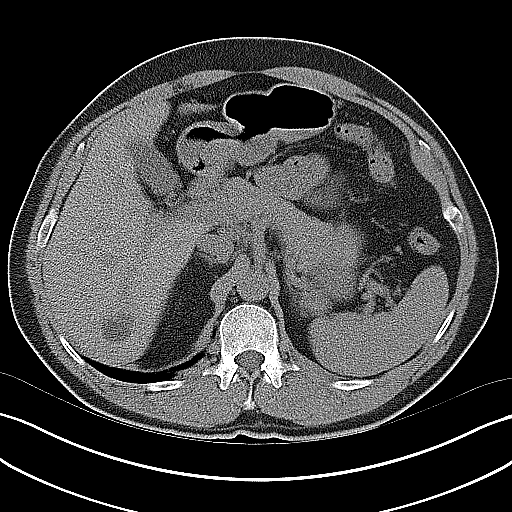
[im 2/24  bone]
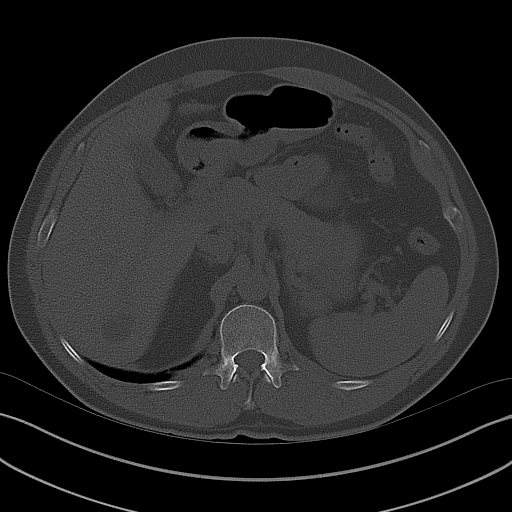
[im 4/24  soft-tissue]
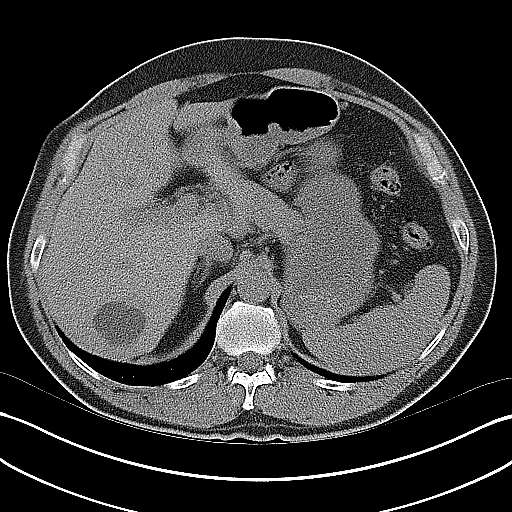
[im 6/24  soft-tissue]
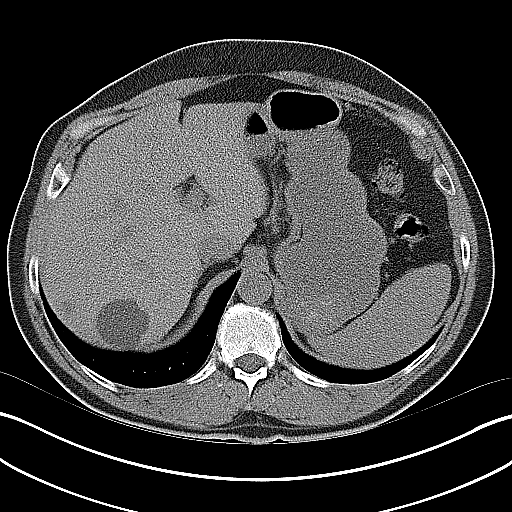
[im 7/24  soft-tissue]
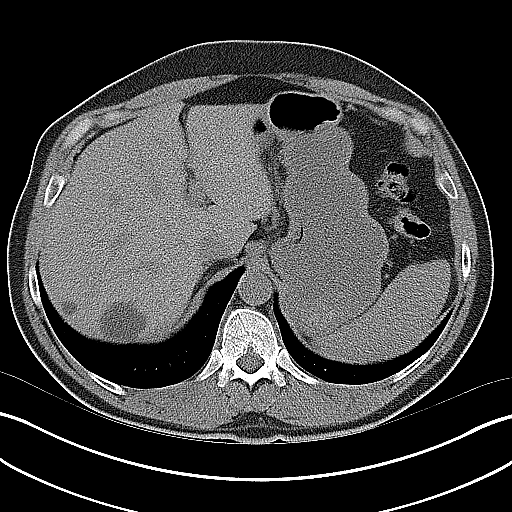
[im 9/24  soft-tissue]
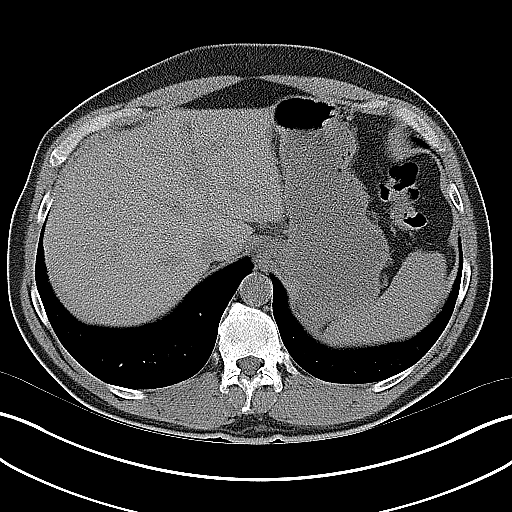
[im 11/24  soft-tissue]
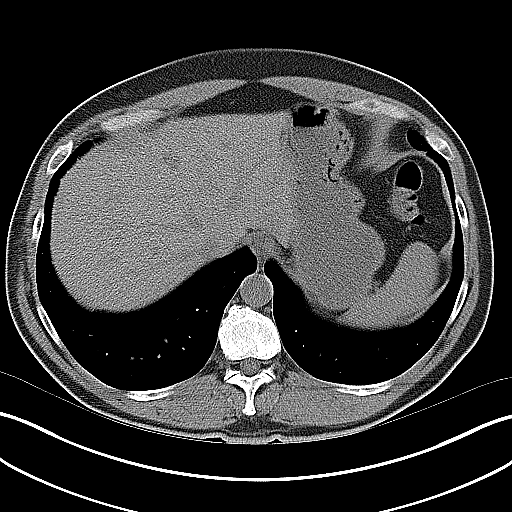
[im 13/24  soft-tissue]
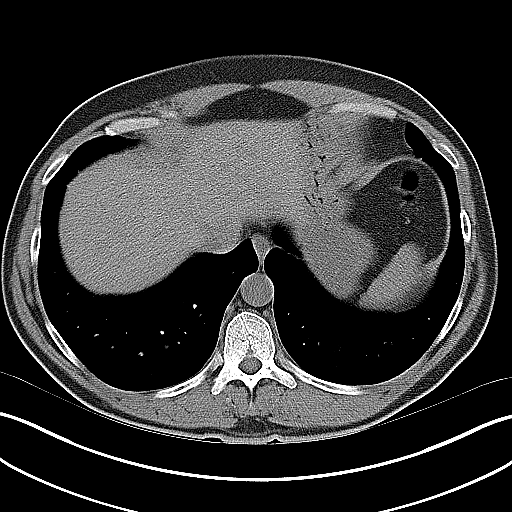
[im 14/24  soft-tissue]
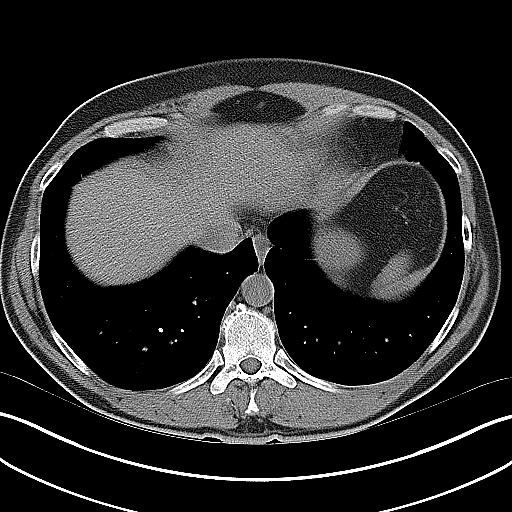
[im 16/24  soft-tissue]
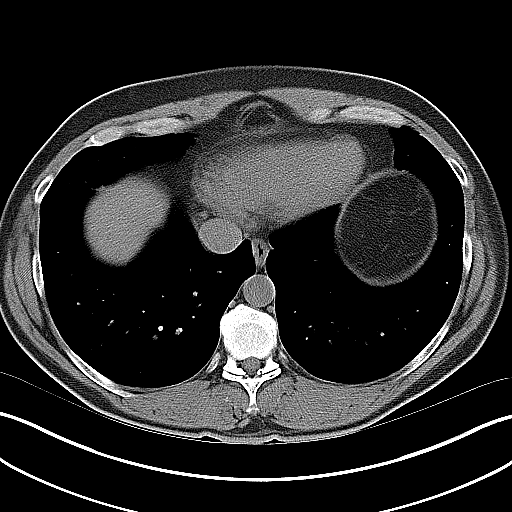
[im 16/24  bone]
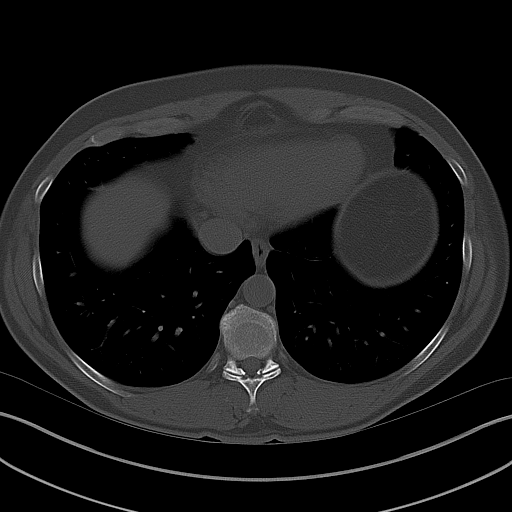
[im 18/24  soft-tissue]
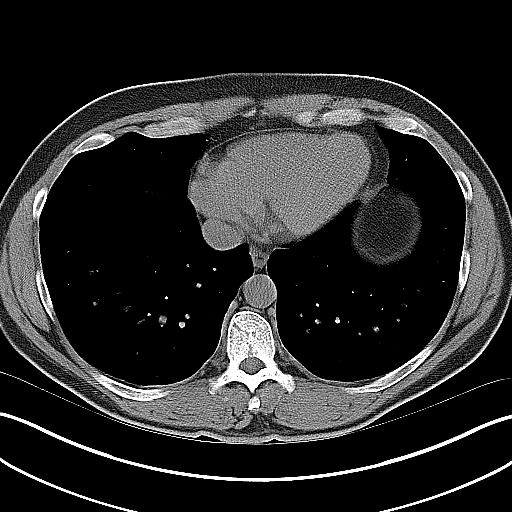
[im 19/24  soft-tissue]
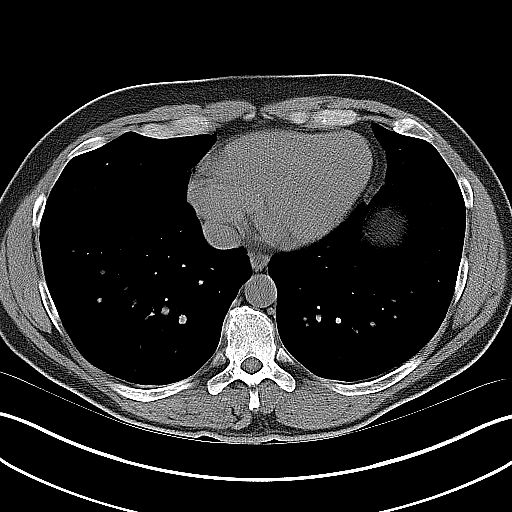
[im 20/24  lung]
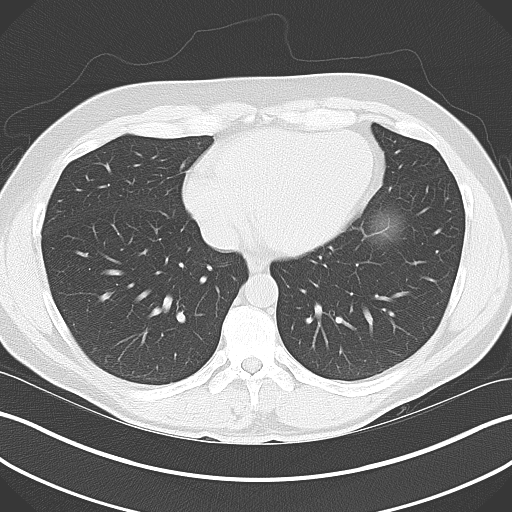
[im 21/24  soft-tissue]
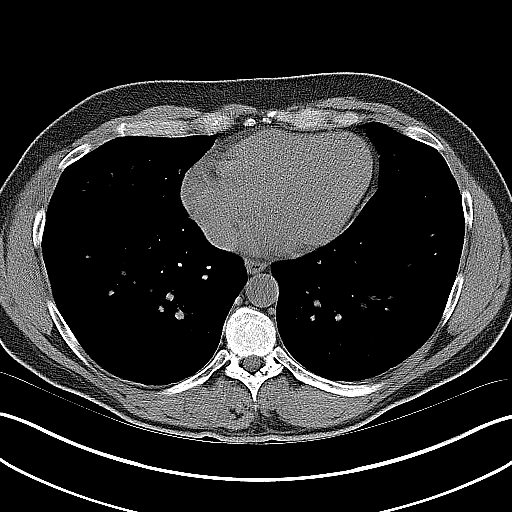
[im 21/24  lung]
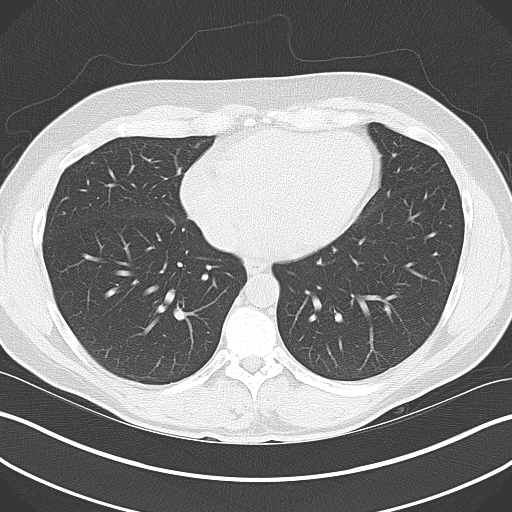
[im 22/24  lung]
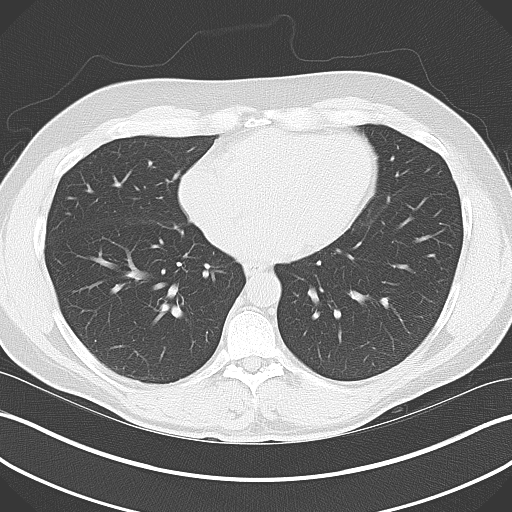
[im 23/24  soft-tissue]
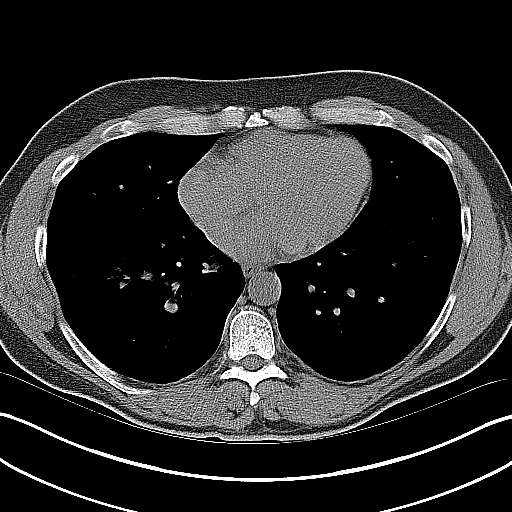
[im 23/24  lung]
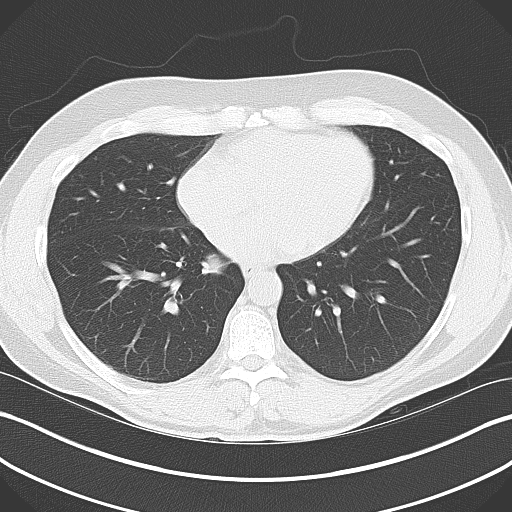

[15 of 24 positions shown; findings below may reference images not displayed]

FINDINGS: BODY WALL: No contributory findings.

LOWER CHEST: No contributory findings.

ABDOMEN/PELVIS:

Liver: Scattered low-density liver lesions have an appearance most
consistent with incidental cysts. The largest is in the posterior
right liver at 42 mm

Biliary: No evidence of biliary obstruction or stone.

Pancreas: Unremarkable.

Spleen: Unremarkable.

Adrenals: Unremarkable.

Kidneys and ureters: 2 mm stone at the right ureteral vesicular
junction with mild hydroureteronephrosis. There is a punctate stone
in the interpolar right kidney. No left-sided hydronephrosis or
urolithiasis.

Bladder: Unremarkable.

Reproductive: No pathologic findings.

Bowel: No obstruction. Negative appendix.

Retroperitoneum: No mass or adenopathy.

Peritoneum: No ascites or pneumoperitoneum.

Vascular: No acute abnormality.

OSSEOUS: Transitional lumbosacral anatomy with rudimentary disc
space at S1-S2. There are bilateral pars defects at L5 with
borderline grade 2 anterolisthesis. Disc uncovering and slip causes
bilateral foraminal stenosis.
IMPRESSION: 1. 2 mm stone at the right UVJ with mild urinary obstruction.
2. Punctate right nephrolithiasis.
3. L5 pars defects with anterolisthesis and foraminal stenosis.

## 2016-07-21 DIAGNOSIS — Z6827 Body mass index (BMI) 27.0-27.9, adult: Secondary | ICD-10-CM | POA: Diagnosis not present

## 2016-07-21 DIAGNOSIS — R509 Fever, unspecified: Secondary | ICD-10-CM | POA: Diagnosis not present

## 2016-07-21 DIAGNOSIS — J111 Influenza due to unidentified influenza virus with other respiratory manifestations: Secondary | ICD-10-CM | POA: Diagnosis not present

## 2016-07-21 DIAGNOSIS — J019 Acute sinusitis, unspecified: Secondary | ICD-10-CM | POA: Diagnosis not present

## 2016-09-16 DIAGNOSIS — Z0100 Encounter for examination of eyes and vision without abnormal findings: Secondary | ICD-10-CM | POA: Diagnosis not present

## 2016-10-06 DIAGNOSIS — E784 Other hyperlipidemia: Secondary | ICD-10-CM | POA: Diagnosis not present

## 2016-10-06 DIAGNOSIS — R030 Elevated blood-pressure reading, without diagnosis of hypertension: Secondary | ICD-10-CM | POA: Diagnosis not present

## 2016-10-06 DIAGNOSIS — Z125 Encounter for screening for malignant neoplasm of prostate: Secondary | ICD-10-CM | POA: Diagnosis not present

## 2016-10-14 DIAGNOSIS — K649 Unspecified hemorrhoids: Secondary | ICD-10-CM | POA: Diagnosis not present

## 2016-10-14 DIAGNOSIS — Z1389 Encounter for screening for other disorder: Secondary | ICD-10-CM | POA: Diagnosis not present

## 2016-10-14 DIAGNOSIS — J3089 Other allergic rhinitis: Secondary | ICD-10-CM | POA: Diagnosis not present

## 2016-10-14 DIAGNOSIS — E784 Other hyperlipidemia: Secondary | ICD-10-CM | POA: Diagnosis not present

## 2016-10-14 DIAGNOSIS — Z0000 Encounter for general adult medical examination without abnormal findings: Secondary | ICD-10-CM | POA: Diagnosis not present

## 2017-02-25 DIAGNOSIS — Z23 Encounter for immunization: Secondary | ICD-10-CM | POA: Diagnosis not present

## 2018-02-23 DIAGNOSIS — Z0000 Encounter for general adult medical examination without abnormal findings: Secondary | ICD-10-CM | POA: Diagnosis not present

## 2018-02-23 DIAGNOSIS — E7849 Other hyperlipidemia: Secondary | ICD-10-CM | POA: Diagnosis not present

## 2018-02-23 DIAGNOSIS — R82998 Other abnormal findings in urine: Secondary | ICD-10-CM | POA: Diagnosis not present

## 2018-02-23 DIAGNOSIS — Z125 Encounter for screening for malignant neoplasm of prostate: Secondary | ICD-10-CM | POA: Diagnosis not present

## 2018-03-02 DIAGNOSIS — E7849 Other hyperlipidemia: Secondary | ICD-10-CM | POA: Diagnosis not present

## 2018-03-02 DIAGNOSIS — Z0000 Encounter for general adult medical examination without abnormal findings: Secondary | ICD-10-CM | POA: Diagnosis not present

## 2018-03-02 DIAGNOSIS — M79673 Pain in unspecified foot: Secondary | ICD-10-CM | POA: Diagnosis not present

## 2018-03-02 DIAGNOSIS — R030 Elevated blood-pressure reading, without diagnosis of hypertension: Secondary | ICD-10-CM | POA: Diagnosis not present

## 2018-03-02 DIAGNOSIS — Z1389 Encounter for screening for other disorder: Secondary | ICD-10-CM | POA: Diagnosis not present

## 2018-03-02 DIAGNOSIS — Z23 Encounter for immunization: Secondary | ICD-10-CM | POA: Diagnosis not present

## 2018-04-06 DIAGNOSIS — K219 Gastro-esophageal reflux disease without esophagitis: Secondary | ICD-10-CM | POA: Diagnosis not present

## 2018-04-06 DIAGNOSIS — J029 Acute pharyngitis, unspecified: Secondary | ICD-10-CM | POA: Diagnosis not present

## 2018-04-06 DIAGNOSIS — R002 Palpitations: Secondary | ICD-10-CM | POA: Diagnosis not present

## 2018-08-30 DIAGNOSIS — E291 Testicular hypofunction: Secondary | ICD-10-CM | POA: Diagnosis not present

## 2018-08-30 DIAGNOSIS — R946 Abnormal results of thyroid function studies: Secondary | ICD-10-CM | POA: Diagnosis not present

## 2018-08-30 DIAGNOSIS — E785 Hyperlipidemia, unspecified: Secondary | ICD-10-CM | POA: Diagnosis not present

## 2018-08-30 DIAGNOSIS — I10 Essential (primary) hypertension: Secondary | ICD-10-CM | POA: Diagnosis not present

## 2018-08-30 DIAGNOSIS — R109 Unspecified abdominal pain: Secondary | ICD-10-CM | POA: Diagnosis not present

## 2019-02-01 DIAGNOSIS — Z23 Encounter for immunization: Secondary | ICD-10-CM | POA: Diagnosis not present

## 2019-02-08 ENCOUNTER — Ambulatory Visit: Payer: BLUE CROSS/BLUE SHIELD | Admitting: Family Medicine

## 2019-02-08 ENCOUNTER — Other Ambulatory Visit: Payer: Self-pay

## 2019-02-08 VITALS — BP 132/88 | Ht 71.5 in | Wt 210.0 lb

## 2019-02-08 DIAGNOSIS — M2141 Flat foot [pes planus] (acquired), right foot: Secondary | ICD-10-CM

## 2019-02-08 DIAGNOSIS — R269 Unspecified abnormalities of gait and mobility: Secondary | ICD-10-CM

## 2019-02-08 DIAGNOSIS — M76821 Posterior tibial tendinitis, right leg: Secondary | ICD-10-CM

## 2019-02-08 DIAGNOSIS — M2142 Flat foot [pes planus] (acquired), left foot: Secondary | ICD-10-CM

## 2019-02-08 NOTE — Progress Notes (Signed)
Patient here for new orthotics.  The last ones he got were in 2017 and they are starting to wear fairly thin.  He uses them all the time.  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  He also has history of right posterior tibial tendon insufficiency and the orthotic significantly helped that.  He would like to get 2 pairs. Patient was fitted for a : standard, cushioned, semi-rigid orthotic. The orthotic was heated, placed on the orthotic stand. The patient was positioned in subtalar neutral position and 10 degrees of ankle dorsiflexion in a weight bearing stance on the heated orthotic blank After completion of molding, a stable base was applied to the orthotic blank. The blank was ground to a stable position for weight bearing. Blank: Size 12 red Base: Blue EVA foam Posting: Bilaterally he had medium scaphoid pads placed underneath the orthotic.  On the right foot I placed medial to lateral 3/16 inch heel wedge to improve his posterior tibial insufficiency.

## 2019-04-12 DIAGNOSIS — E7849 Other hyperlipidemia: Secondary | ICD-10-CM | POA: Diagnosis not present

## 2019-04-12 DIAGNOSIS — Z125 Encounter for screening for malignant neoplasm of prostate: Secondary | ICD-10-CM | POA: Diagnosis not present

## 2019-04-18 DIAGNOSIS — R82998 Other abnormal findings in urine: Secondary | ICD-10-CM | POA: Diagnosis not present

## 2019-04-19 DIAGNOSIS — Z1331 Encounter for screening for depression: Secondary | ICD-10-CM | POA: Diagnosis not present

## 2019-04-19 DIAGNOSIS — R079 Chest pain, unspecified: Secondary | ICD-10-CM | POA: Diagnosis not present

## 2019-04-19 DIAGNOSIS — M25521 Pain in right elbow: Secondary | ICD-10-CM | POA: Diagnosis not present

## 2019-04-19 DIAGNOSIS — R030 Elevated blood-pressure reading, without diagnosis of hypertension: Secondary | ICD-10-CM | POA: Diagnosis not present

## 2019-04-19 DIAGNOSIS — Z0000 Encounter for general adult medical examination without abnormal findings: Secondary | ICD-10-CM | POA: Diagnosis not present

## 2019-04-19 DIAGNOSIS — K219 Gastro-esophageal reflux disease without esophagitis: Secondary | ICD-10-CM | POA: Diagnosis not present

## 2019-04-23 ENCOUNTER — Other Ambulatory Visit: Payer: Self-pay | Admitting: *Deleted

## 2019-04-23 DIAGNOSIS — E785 Hyperlipidemia, unspecified: Secondary | ICD-10-CM

## 2019-04-23 DIAGNOSIS — Z136 Encounter for screening for cardiovascular disorders: Secondary | ICD-10-CM

## 2019-05-31 DIAGNOSIS — Z23 Encounter for immunization: Secondary | ICD-10-CM | POA: Diagnosis not present

## 2019-06-21 DIAGNOSIS — Z23 Encounter for immunization: Secondary | ICD-10-CM | POA: Diagnosis not present

## 2019-07-02 ENCOUNTER — Telehealth: Payer: Self-pay | Admitting: *Deleted

## 2019-07-02 NOTE — Telephone Encounter (Signed)
Called patient 3 times left message to call back to schedule CT CA Score for Dr Waynard Edwards. Patient has not called to schedule faxed Dr Waynard Edwards to make him aware I am unable to get in touch with patient.

## 2019-07-26 DIAGNOSIS — Z1212 Encounter for screening for malignant neoplasm of rectum: Secondary | ICD-10-CM | POA: Diagnosis not present

## 2019-07-26 DIAGNOSIS — Z1211 Encounter for screening for malignant neoplasm of colon: Secondary | ICD-10-CM | POA: Diagnosis not present

## 2019-08-30 DIAGNOSIS — Z0100 Encounter for examination of eyes and vision without abnormal findings: Secondary | ICD-10-CM | POA: Diagnosis not present

## 2020-02-29 DIAGNOSIS — Z23 Encounter for immunization: Secondary | ICD-10-CM | POA: Diagnosis not present

## 2020-04-01 DIAGNOSIS — Z20822 Contact with and (suspected) exposure to covid-19: Secondary | ICD-10-CM | POA: Diagnosis not present

## 2020-04-16 DIAGNOSIS — Z125 Encounter for screening for malignant neoplasm of prostate: Secondary | ICD-10-CM | POA: Diagnosis not present

## 2020-04-16 DIAGNOSIS — E785 Hyperlipidemia, unspecified: Secondary | ICD-10-CM | POA: Diagnosis not present

## 2020-04-24 DIAGNOSIS — Z0000 Encounter for general adult medical examination without abnormal findings: Secondary | ICD-10-CM | POA: Diagnosis not present

## 2020-04-24 DIAGNOSIS — E785 Hyperlipidemia, unspecified: Secondary | ICD-10-CM | POA: Diagnosis not present

## 2020-04-24 DIAGNOSIS — R82998 Other abnormal findings in urine: Secondary | ICD-10-CM | POA: Diagnosis not present

## 2020-04-27 ENCOUNTER — Other Ambulatory Visit: Payer: Self-pay | Admitting: Internal Medicine

## 2020-04-27 DIAGNOSIS — E785 Hyperlipidemia, unspecified: Secondary | ICD-10-CM

## 2020-05-07 DIAGNOSIS — Z20822 Contact with and (suspected) exposure to covid-19: Secondary | ICD-10-CM | POA: Diagnosis not present

## 2020-05-27 DIAGNOSIS — Z1152 Encounter for screening for COVID-19: Secondary | ICD-10-CM | POA: Diagnosis not present

## 2020-05-29 ENCOUNTER — Inpatient Hospital Stay: Admission: RE | Admit: 2020-05-29 | Payer: Self-pay | Source: Ambulatory Visit

## 2020-06-12 ENCOUNTER — Other Ambulatory Visit: Payer: Self-pay

## 2020-07-09 DIAGNOSIS — G5603 Carpal tunnel syndrome, bilateral upper limbs: Secondary | ICD-10-CM | POA: Diagnosis not present

## 2020-07-09 DIAGNOSIS — G5601 Carpal tunnel syndrome, right upper limb: Secondary | ICD-10-CM | POA: Diagnosis not present

## 2020-08-21 DIAGNOSIS — E785 Hyperlipidemia, unspecified: Secondary | ICD-10-CM | POA: Diagnosis not present

## 2020-09-04 ENCOUNTER — Ambulatory Visit
Admission: RE | Admit: 2020-09-04 | Discharge: 2020-09-04 | Disposition: A | Discharge status: Home / Self Care | Payer: No Typology Code available for payment source | Source: Ambulatory Visit | Attending: Internal Medicine | Admitting: Internal Medicine

## 2020-09-04 DIAGNOSIS — E785 Hyperlipidemia, unspecified: Secondary | ICD-10-CM

## 2020-11-06 DIAGNOSIS — R3121 Asymptomatic microscopic hematuria: Secondary | ICD-10-CM | POA: Diagnosis not present

## 2020-11-13 DIAGNOSIS — M79631 Pain in right forearm: Secondary | ICD-10-CM | POA: Diagnosis not present

## 2020-11-13 DIAGNOSIS — M67839 Other specified disorders of synovium and tendon, unspecified forearm: Secondary | ICD-10-CM | POA: Diagnosis not present

## 2020-11-20 DIAGNOSIS — R3121 Asymptomatic microscopic hematuria: Secondary | ICD-10-CM | POA: Diagnosis not present

## 2020-11-20 DIAGNOSIS — K7689 Other specified diseases of liver: Secondary | ICD-10-CM | POA: Diagnosis not present

## 2020-11-20 DIAGNOSIS — N200 Calculus of kidney: Secondary | ICD-10-CM | POA: Diagnosis not present

## 2020-12-03 DIAGNOSIS — G5603 Carpal tunnel syndrome, bilateral upper limbs: Secondary | ICD-10-CM | POA: Diagnosis not present

## 2020-12-03 DIAGNOSIS — G5601 Carpal tunnel syndrome, right upper limb: Secondary | ICD-10-CM | POA: Diagnosis not present

## 2020-12-03 DIAGNOSIS — M67839 Other specified disorders of synovium and tendon, unspecified forearm: Secondary | ICD-10-CM | POA: Diagnosis not present

## 2021-01-06 DIAGNOSIS — G5603 Carpal tunnel syndrome, bilateral upper limbs: Secondary | ICD-10-CM | POA: Diagnosis not present

## 2021-01-13 DIAGNOSIS — G5603 Carpal tunnel syndrome, bilateral upper limbs: Secondary | ICD-10-CM | POA: Diagnosis not present

## 2021-02-20 DIAGNOSIS — Z23 Encounter for immunization: Secondary | ICD-10-CM | POA: Diagnosis not present

## 2021-03-12 DIAGNOSIS — N200 Calculus of kidney: Secondary | ICD-10-CM | POA: Diagnosis not present

## 2021-03-12 DIAGNOSIS — N132 Hydronephrosis with renal and ureteral calculous obstruction: Secondary | ICD-10-CM | POA: Diagnosis not present

## 2021-03-12 DIAGNOSIS — N201 Calculus of ureter: Secondary | ICD-10-CM | POA: Diagnosis not present

## 2021-03-17 DIAGNOSIS — R3121 Asymptomatic microscopic hematuria: Secondary | ICD-10-CM | POA: Diagnosis not present

## 2021-03-17 DIAGNOSIS — N23 Unspecified renal colic: Secondary | ICD-10-CM | POA: Diagnosis not present

## 2021-04-23 DIAGNOSIS — N201 Calculus of ureter: Secondary | ICD-10-CM | POA: Diagnosis not present

## 2021-04-30 DIAGNOSIS — G5601 Carpal tunnel syndrome, right upper limb: Secondary | ICD-10-CM | POA: Diagnosis not present

## 2021-05-05 ENCOUNTER — Ambulatory Visit: Payer: BC Managed Care – PPO | Admitting: Sports Medicine

## 2021-05-05 VITALS — BP 130/86 | Ht 70.25 in | Wt 225.0 lb

## 2021-05-05 DIAGNOSIS — M2141 Flat foot [pes planus] (acquired), right foot: Secondary | ICD-10-CM | POA: Diagnosis not present

## 2021-05-05 DIAGNOSIS — M722 Plantar fascial fibromatosis: Secondary | ICD-10-CM | POA: Diagnosis not present

## 2021-05-05 DIAGNOSIS — M79671 Pain in right foot: Secondary | ICD-10-CM

## 2021-05-05 DIAGNOSIS — M2142 Flat foot [pes planus] (acquired), left foot: Secondary | ICD-10-CM | POA: Diagnosis not present

## 2021-05-05 NOTE — Patient Instructions (Signed)
It was great to meet you today, thank you for letting me participate in your care!  Today, we discussed your heel pain - seems most indicative of plantar fasciitis  Things to do for this: - remain in your orthotics - replaced heel wedge today - would recommend trying checking at [facility] Feet for [place] with that custom orthotic. Recommend replacing [place] boot soles/new boots - do eccentric heel/calf exercises daily - recommend [place] sock at bedtime ([employer], etc.) - Frozen water bottle/ice for 15-20 mins multiple times daily on bottom of foot/heel - continue course of anti-inflammatories for the next week  You will follow-up in about one month if pain not improving.  If you have any further questions, please give the clinic a call [phone].  Cheers,  Madelyn Brunner, DO [facility]

## 2021-05-05 NOTE — Progress Notes (Signed)
PCP: Rodrigo Ran, MD  Subjective:   HPI: Andre Rogers is a pleasant 55 y.o. male here for right foot and heel pain.  Patient states he has been bothered by right plantar aspect heel pain for the last few weeks to few months.  He has been treated in the past for posterior tibialis dysfunction of the right foot.  He has seen Dr. Darrick Penna in the sports medicine center many times in the past few years and does wear custom orthotics and all of his shoes.  He states that over the last few weeks he has gotten worsening heel pain on the plantar aspect of his right foot, specifically with first steps in the morning. He states when he gets out of bed in the morning he will have severe pain with first steps and will be difficult to walk, although as he takes a few steps the pain eases throughout the day.  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  He states though he is able to exercise when he walks multiple miles a day he does not have any issues with this only after prolonged period of rest.  He did recently have carpal tunnel surgery and so has been taking ibuprofen and Tylenol for this as well which he feels like is helping the right heel as well.  He is also not been quite as active on his feet.  Otherwise he has not tried any other treatments.  He was given a ankle compression sleeve for his PT dysfunction in the past and he did try this, although it is not helped.  He states the pain is very sharp right at the insertion point on the calcaneus, he denies any numbness and tingling.  Denies any weakness.  No erythema, redness or swelling.  No history of gout.  Past Medical History:  Diagnosis Date   High cholesterol     Current Outpatient Medications on File Prior to Visit  Medication Sig Dispense Refill   Ascorbic Acid (VITAMIN C) 100 MG tablet Take 100 mg by mouth daily.     co-enzyme Q-10 30  MG capsule Take 30 mg by mouth daily.     colesevelam (WELCHOL) 625 MG tablet Take 1,875 mg by mouth daily.     FIBER FORMULA PO Take 1 tablet by mouth daily.     glucosamine-chondroitin 500-400 MG tablet Take 1 tablet by mouth 3 (three) times daily.     HYDROcodone-acetaminophen (NORCO/VICODIN) 5-325 MG per tablet Take 2 tablets by mouth every 4 (four) hours as needed. (Patient not taking: Reported on 02/08/2019) 6 tablet 0   ibuprofen (ADVIL,MOTRIN) 800 MG tablet Take 1 tablet (800 mg total) by mouth 3 (three) times daily. (Patient not taking: Reported on 02/08/2019) 21 tablet 0   Multiple Vitamin (MULTI-VITAMIN DAILY PO) Take 1 tablet by mouth daily.     naproxen sodium (ANAPROX) 220 MG tablet Take 440 mg by mouth 2 (two) times daily as needed (pain).     ondansetron (ZOFRAN ODT) 8 MG disintegrating tablet Take 1 tablet (8 mg total) by mouth every 8 (eight) hours as needed for nausea or vomiting. (Patient not taking: Reported on 02/08/2019) 20 tablet 0   tamsulosin (FLOMAX) 0.4 MG CAPS capsule Take 1 capsule (0.4 mg total) by mouth daily. (Patient not taking: Reported on 02/18/2015) 10 capsule 0   ZETIA 10  MG tablet Take 10 mg by mouth daily.  11   No current facility-administered medications on file prior to visit.    Past Surgical History:  Procedure Laterality Date   ANAL FISTULECTOMY      No Known Allergies  BP 130/86    Ht 5' 10.25" (1.784 m)    Wt 225 lb (102.1 kg)    BMI 32.05 kg/m   No flowsheet data found.  No flowsheet data found.      Objective:  Physical Exam:  Gen: Well-appearing, in no acute distress; non-toxic CV: Regular Rate. Well-perfused. Warm.  Resp: Breathing unlabored on room air; no wheezing. Psych: Fluid speech in conversation; appropriate affect; normal thought process Neuro: Sensation intact throughout. No gross coordination deficits.  MSK:  Right foot/ankle: + TTP noted on the medial plantar aspect of the calcaneus with the insertion of the plantar  fascia.  There is no erythema, ecchymosis or swelling in this area.  There is significant pes planus with hindfoot valgus deformity.  Upon stance phase, he does have a loss of both longitudinal and to a lesser extent transverse arch bilaterally. + Navicular drop right greater than left.  There is no TTP of either malleoli, navicular base of fifth metatarsal.  Negative Tinel's at the tarsal tunnel.  There is full range of motion about the ankle, he is able to dorsiflex to approximately 100 degrees bilaterally.  There is no tightening of the heel cord.  Mild posterior tibial dysfunction of the right with heel raises bilaterally.  Negative calcaneal squeeze test.    Assessment & Plan:  1.  Plantar fasciitis, right heel 2.  Pes planus, bilaterally 3.  Hx of posterior tibial dysfunction, right   -Patient did again redemonstrate hyperpronation with walking, I did replace the medial heel wedge for his one orthotic to help correct this -For his plantar fascia pain, provided and demonstrated eccentric heel/calf exercises to perform daily -[place] sock at bedtime -Frozen water bottle/ice for 15 to 20 minutes on the plantar aspect of the heel at least 3-4 times daily -May continue course of anti-inflammatories as needed -May try over-the-counter heel pad/cushion; discussed the importance of supportive shoe wear, may need to replace the soles of his old boots he works in.  Avoid walking barefoot especially on hard surfaces -We will follow-up in about 4 weeks for reevaluation  Madelyn Brunner, DO PGY-4, Sports Medicine Fellow [facility]  Addendum:  I was the preceptor for this visit and available for immediate consultation.  Norton Blizzard MD Marrianne Mood

## 2021-05-06 ENCOUNTER — Encounter: Payer: Self-pay | Admitting: Sports Medicine

## 2021-05-14 DIAGNOSIS — M25531 Pain in right wrist: Secondary | ICD-10-CM | POA: Diagnosis not present

## 2021-06-18 DIAGNOSIS — Z125 Encounter for screening for malignant neoplasm of prostate: Secondary | ICD-10-CM | POA: Diagnosis not present

## 2021-06-18 DIAGNOSIS — E785 Hyperlipidemia, unspecified: Secondary | ICD-10-CM | POA: Diagnosis not present

## 2021-06-25 DIAGNOSIS — Z1331 Encounter for screening for depression: Secondary | ICD-10-CM | POA: Diagnosis not present

## 2021-06-25 DIAGNOSIS — R82998 Other abnormal findings in urine: Secondary | ICD-10-CM | POA: Diagnosis not present

## 2021-06-25 DIAGNOSIS — D179 Benign lipomatous neoplasm, unspecified: Secondary | ICD-10-CM | POA: Diagnosis not present

## 2021-06-25 DIAGNOSIS — Z0000 Encounter for general adult medical examination without abnormal findings: Secondary | ICD-10-CM | POA: Diagnosis not present

## 2021-06-25 DIAGNOSIS — I10 Essential (primary) hypertension: Secondary | ICD-10-CM | POA: Diagnosis not present

## 2022-02-26 DIAGNOSIS — Z23 Encounter for immunization: Secondary | ICD-10-CM | POA: Diagnosis not present

## 2022-07-08 DIAGNOSIS — R030 Elevated blood-pressure reading, without diagnosis of hypertension: Secondary | ICD-10-CM | POA: Diagnosis not present

## 2022-07-08 DIAGNOSIS — R002 Palpitations: Secondary | ICD-10-CM | POA: Diagnosis not present

## 2022-07-08 DIAGNOSIS — K219 Gastro-esophageal reflux disease without esophagitis: Secondary | ICD-10-CM | POA: Diagnosis not present

## 2022-07-08 DIAGNOSIS — Z125 Encounter for screening for malignant neoplasm of prostate: Secondary | ICD-10-CM | POA: Diagnosis not present

## 2022-07-08 DIAGNOSIS — E785 Hyperlipidemia, unspecified: Secondary | ICD-10-CM | POA: Diagnosis not present

## 2022-07-15 DIAGNOSIS — R82998 Other abnormal findings in urine: Secondary | ICD-10-CM | POA: Diagnosis not present

## 2022-07-15 DIAGNOSIS — R030 Elevated blood-pressure reading, without diagnosis of hypertension: Secondary | ICD-10-CM | POA: Diagnosis not present

## 2022-07-15 DIAGNOSIS — I7300 Raynaud's syndrome without gangrene: Secondary | ICD-10-CM | POA: Diagnosis not present

## 2022-07-15 DIAGNOSIS — J309 Allergic rhinitis, unspecified: Secondary | ICD-10-CM | POA: Diagnosis not present

## 2022-07-15 DIAGNOSIS — Z0000 Encounter for general adult medical examination without abnormal findings: Secondary | ICD-10-CM | POA: Diagnosis not present

## 2022-07-15 DIAGNOSIS — Z23 Encounter for immunization: Secondary | ICD-10-CM | POA: Diagnosis not present

## 2022-07-15 DIAGNOSIS — E785 Hyperlipidemia, unspecified: Secondary | ICD-10-CM | POA: Diagnosis not present

## 2022-08-12 DIAGNOSIS — Z1211 Encounter for screening for malignant neoplasm of colon: Secondary | ICD-10-CM | POA: Diagnosis not present

## 2023-02-18 DIAGNOSIS — Z23 Encounter for immunization: Secondary | ICD-10-CM | POA: Diagnosis not present

## 2023-06-23 DIAGNOSIS — H5203 Hypermetropia, bilateral: Secondary | ICD-10-CM | POA: Diagnosis not present

## 2023-06-23 DIAGNOSIS — H04123 Dry eye syndrome of bilateral lacrimal glands: Secondary | ICD-10-CM | POA: Diagnosis not present

## 2023-10-06 DIAGNOSIS — E785 Hyperlipidemia, unspecified: Secondary | ICD-10-CM | POA: Diagnosis not present

## 2023-10-13 DIAGNOSIS — Z1389 Encounter for screening for other disorder: Secondary | ICD-10-CM | POA: Diagnosis not present

## 2023-10-13 DIAGNOSIS — D179 Benign lipomatous neoplasm, unspecified: Secondary | ICD-10-CM | POA: Diagnosis not present

## 2023-10-13 DIAGNOSIS — Z1331 Encounter for screening for depression: Secondary | ICD-10-CM | POA: Diagnosis not present

## 2023-10-13 DIAGNOSIS — Z0000 Encounter for general adult medical examination without abnormal findings: Secondary | ICD-10-CM | POA: Diagnosis not present

## 2023-10-13 DIAGNOSIS — Z23 Encounter for immunization: Secondary | ICD-10-CM | POA: Diagnosis not present

## 2023-10-13 DIAGNOSIS — N529 Male erectile dysfunction, unspecified: Secondary | ICD-10-CM | POA: Diagnosis not present

## 2023-10-13 DIAGNOSIS — R82998 Other abnormal findings in urine: Secondary | ICD-10-CM | POA: Diagnosis not present

## 2024-02-14 DIAGNOSIS — Z23 Encounter for immunization: Secondary | ICD-10-CM | POA: Diagnosis not present
